# Patient Record
Sex: Female | Born: 1995 | Race: Black or African American | Hispanic: No | Marital: Single | Smoking: Never smoker
Health system: Southern US, Community
[De-identification: ages and names within clinical notes are randomized; demographics above are authoritative.]

## PROBLEM LIST (undated history)

## (undated) DIAGNOSIS — Z3490 Encounter for supervision of normal pregnancy, unspecified, unspecified trimester: Secondary | ICD-10-CM

## (undated) HISTORY — PX: EYE SURGERY: SHX253

---

## 2015-09-14 ENCOUNTER — Other Ambulatory Visit (HOSPITAL_COMMUNITY)
Admission: RE | Admit: 2015-09-14 | Discharge: 2015-09-14 | Disposition: A | Discharge status: Home / Self Care | Payer: Medicaid Other | Source: Ambulatory Visit | Attending: Family Medicine | Admitting: Family Medicine

## 2015-09-14 ENCOUNTER — Emergency Department (INDEPENDENT_AMBULATORY_CARE_PROVIDER_SITE_OTHER)
Admission: EM | Admit: 2015-09-14 | Discharge: 2015-09-14 | Disposition: A | Discharge status: Home / Self Care | Payer: Medicaid Other | Source: Home / Self Care | Attending: Family Medicine | Admitting: Family Medicine

## 2015-09-14 ENCOUNTER — Encounter (HOSPITAL_COMMUNITY): Payer: Self-pay | Admitting: *Deleted

## 2015-09-14 DIAGNOSIS — Z113 Encounter for screening for infections with a predominantly sexual mode of transmission: Secondary | ICD-10-CM | POA: Insufficient documentation

## 2015-09-14 DIAGNOSIS — N760 Acute vaginitis: Secondary | ICD-10-CM | POA: Insufficient documentation

## 2015-09-14 LAB — POCT URINALYSIS DIP (DEVICE)
Bilirubin Urine: NEGATIVE
Glucose, UA: NEGATIVE mg/dL
Hgb urine dipstick: NEGATIVE
KETONES UR: NEGATIVE mg/dL
Nitrite: NEGATIVE
PH: 7.5 (ref 5.0–8.0)
PROTEIN: NEGATIVE mg/dL
SPECIFIC GRAVITY, URINE: 1.015 (ref 1.005–1.030)
Urobilinogen, UA: 0.2 mg/dL (ref 0.0–1.0)

## 2015-09-14 LAB — POCT PREGNANCY, URINE: PREG TEST UR: NEGATIVE

## 2015-09-14 MED ORDER — METRONIDAZOLE 0.75 % VA GEL: 1.0000 | Freq: Every day | VAGINAL | Status: DC

## 2015-09-14 NOTE — Discharge Instructions (Signed)
We will call with positive test results and treat as indicated  °

## 2015-09-14 NOTE — ED Notes (Signed)
Pt  Reports   Symptoms of vaginal  Itching        And   Discharge   For   Several  Days    Worse  Yesterday           Denys  Any  Bleeding

## 2015-09-14 NOTE — ED Provider Notes (Signed)
CSN: [id number]     Arrival date & time 09/14/15  1412 History   First MD Initiated Contact with Patient 09/14/15 1427     Chief Complaint  Patient presents with  . Vaginal Discharge   (Consider location/radiation/quality/duration/timing/severity/associated sxs/prior Treatment) Patient is a 19 y.o. female presenting with vaginal discharge. The history is provided by the patient.  Vaginal Discharge Quality:  Mucoid Severity:  Mild Duration:  2 days Progression:  Unchanged Chronicity:  New Relieved by:  None tried Worsened by:  Nothing tried Ineffective treatments:  None tried Associated symptoms: no abdominal pain and no dysuria     History reviewed. No pertinent past medical history. History reviewed. No pertinent past surgical history. History reviewed. No pertinent family history. Social History  Substance Use Topics  . Smoking status: None  . Smokeless tobacco: None  . Alcohol Use: No   OB History    No data available     Review of Systems  Gastrointestinal: Negative.  Negative for abdominal pain.  Genitourinary: Positive for vaginal discharge. Negative for dysuria, vaginal bleeding, menstrual problem and pelvic pain.  All other systems reviewed and are negative.   Allergies  Review of patient's allergies indicates not on file.  Home Medications   Prior to Admission medications   Medication Sig Start Date End Date Taking? Authorizing Provider  metroNIDAZOLE (METROGEL VAGINAL) 0.75 % vaginal gel Place 1 Applicatorful vaginally at bedtime. For 5 nights 09/14/15   Linna Hoff, MD   Meds Ordered and Administered this Visit  Medications - No data to display  BP 120/60 mmHg  Pulse 78  Temp(Src) 98.6 F (37 C) (Oral)  Resp 16  LMP 09/11/2015 No data found.   Physical Exam  Constitutional: She is oriented to person, place, and time. She appears well-developed and well-nourished.  Abdominal: Soft. Bowel sounds are normal.  Genitourinary: Vagina normal and  uterus normal. Pelvic exam was performed with patient supine. No erythema, tenderness or bleeding in the vagina. No foreign body around the vagina. No vaginal discharge found.  Musculoskeletal: Normal range of motion.  Neurological: She is alert and oriented to person, place, and time.  Skin: Skin is warm and dry.  Nursing note and vitals reviewed.   ED Course  Procedures (including critical care time)  Labs Review Labs Reviewed  POCT URINALYSIS DIP (DEVICE) - Abnormal; Notable for the following:    Leukocytes, UA SMALL (*)    All other components within normal limits  HIV ANTIBODY (ROUTINE TESTING)  POCT PREGNANCY, URINE  CERVICOVAGINAL ANCILLARY ONLY    Imaging Review No results found.   Visual Acuity Review  Right Eye Distance:   Left Eye Distance:   Bilateral Distance:    Right Eye Near:   Left Eye Near:    Bilateral Near:         MDM   1. Vaginitis    rx for metrogel vag.    Linna Hoff, MD 09/14/15 [phone]

## 2015-09-15 LAB — CERVICOVAGINAL ANCILLARY ONLY
Chlamydia: NEGATIVE
Neisseria Gonorrhea: NEGATIVE

## 2015-09-18 LAB — CERVICOVAGINAL ANCILLARY ONLY: Wet Prep (BD Affirm): POSITIVE — AB

## 2015-09-18 NOTE — ED Notes (Signed)
Final report of vaginal swab labs positive for gardnerella only called patinet, and discussed labs, and to use Rx as written

## 2016-11-25 ENCOUNTER — Encounter (HOSPITAL_COMMUNITY): Payer: Self-pay

## 2016-11-25 ENCOUNTER — Emergency Department (HOSPITAL_COMMUNITY): Payer: Medicaid Other

## 2016-11-25 ENCOUNTER — Emergency Department (HOSPITAL_COMMUNITY)
Admission: EM | Admit: 2016-11-25 | Discharge: 2016-11-25 | Disposition: A | Discharge status: Home / Self Care | Payer: Medicaid Other | Attending: Emergency Medicine | Admitting: Emergency Medicine

## 2016-11-25 DIAGNOSIS — O0281 Inappropriate change in quantitative human chorionic gonadotropin (hCG) in early pregnancy: Secondary | ICD-10-CM | POA: Insufficient documentation

## 2016-11-25 DIAGNOSIS — O26891 Other specified pregnancy related conditions, first trimester: Secondary | ICD-10-CM | POA: Diagnosis present

## 2016-11-25 DIAGNOSIS — R112 Nausea with vomiting, unspecified: Secondary | ICD-10-CM

## 2016-11-25 DIAGNOSIS — O26892 Other specified pregnancy related conditions, second trimester: Secondary | ICD-10-CM

## 2016-11-25 DIAGNOSIS — O219 Vomiting of pregnancy, unspecified: Secondary | ICD-10-CM | POA: Insufficient documentation

## 2016-11-25 DIAGNOSIS — R109 Unspecified abdominal pain: Secondary | ICD-10-CM

## 2016-11-25 LAB — WET PREP, GENITAL
Clue Cells Wet Prep HPF POC: NONE SEEN
Sperm: NONE SEEN
Trich, Wet Prep: NONE SEEN
Yeast Wet Prep HPF POC: NONE SEEN

## 2016-11-25 LAB — CBC
HCT: 35.4 % — ABNORMAL LOW (ref 36.0–46.0)
Hemoglobin: 11.8 g/dL — ABNORMAL LOW (ref 12.0–15.0)
MCH: 27.4 pg (ref 26.0–34.0)
MCHC: 33.3 g/dL (ref 30.0–36.0)
MCV: 82.1 fL (ref 78.0–100.0)
PLATELETS: 192 10*3/uL (ref 150–400)
RBC: 4.31 MIL/uL (ref 3.87–5.11)
RDW: 13.3 % (ref 11.5–15.5)
WBC: 10 10*3/uL (ref 4.0–10.5)

## 2016-11-25 LAB — COMPREHENSIVE METABOLIC PANEL
ALT: 18 U/L (ref 14–54)
AST: 21 U/L (ref 15–41)
Albumin: 3.4 g/dL — ABNORMAL LOW (ref 3.5–5.0)
Alkaline Phosphatase: 47 U/L (ref 38–126)
Anion gap: 11 (ref 5–15)
BILIRUBIN TOTAL: 0.6 mg/dL (ref 0.3–1.2)
BUN: 11 mg/dL (ref 6–20)
CALCIUM: 8.9 mg/dL (ref 8.9–10.3)
CO2: 21 mmol/L — ABNORMAL LOW (ref 22–32)
CREATININE: 0.7 mg/dL (ref 0.44–1.00)
Chloride: 105 mmol/L (ref 101–111)
GFR calc Af Amer: >60 mL/min (ref >60)
GFR calc non Af Amer: >60 mL/min (ref >60)
Glucose, Bld: 100 mg/dL — ABNORMAL HIGH (ref 65–99)
Potassium: 3.8 mmol/L (ref 3.5–5.1)
Sodium: 137 mmol/L (ref 135–145)
TOTAL PROTEIN: 6.6 g/dL (ref 6.5–8.1)

## 2016-11-25 LAB — URINALYSIS, ROUTINE W REFLEX MICROSCOPIC
Bilirubin Urine: NEGATIVE
Glucose, UA: NEGATIVE mg/dL
Hgb urine dipstick: NEGATIVE
KETONES UR: NEGATIVE mg/dL
LEUKOCYTES UA: NEGATIVE
NITRITE: NEGATIVE
PROTEIN: NEGATIVE mg/dL
Specific Gravity, Urine: 1.027 (ref 1.005–1.030)
pH: 5 (ref 5.0–8.0)

## 2016-11-25 LAB — ABO/RH: ABO/RH(D): O POS

## 2016-11-25 LAB — HCG, QUANTITATIVE, PREGNANCY: hCG, Beta Chain, Quant, S: 135895 m[IU]/mL — ABNORMAL HIGH (ref <5)

## 2016-11-25 LAB — LIPASE, BLOOD: Lipase: 16 U/L (ref 11–51)

## 2016-11-25 MED ORDER — ACETAMINOPHEN 500 MG PO TABS
1000.0000 mg | ORAL_TABLET | Freq: Once | ORAL | Status: AC
  Administered 2016-11-25: 1000 mg via ORAL
  Filled 2016-11-25: qty 2

## 2016-11-25 MED ORDER — METOCLOPRAMIDE HCL 10 MG PO TABS
10.0000 mg | ORAL_TABLET | Freq: Once | ORAL | Status: AC
  Administered 2016-11-25: 10 mg via ORAL
  Filled 2016-11-25: qty 1

## 2016-11-25 MED ORDER — METOCLOPRAMIDE HCL 10 MG PO TABS: 10.0000 mg | ORAL_TABLET | Freq: Four times a day (QID) | ORAL | 0 refills | Status: DC | PRN

## 2016-11-25 NOTE — Discharge Instructions (Signed)
You may take Tylenol 1000 mg every 6 hours as needed for pain.   Please follow-up with your OB/GYN.

## 2016-11-25 NOTE — ED Notes (Signed)
Patient transported to Ultrasound 

## 2016-11-25 NOTE — ED Notes (Signed)
ED Provider at bedside. 

## 2016-11-25 NOTE — ED Triage Notes (Signed)
Pt complaining of nausea and vomiting x 4. Pt complaining of generalized weakness. Pt complaining of lower abdominal pain. Pt states [redacted] weeks pregnant. Pt denies any urinary issues. Pt denies bleeding, states some white discharge.

## 2016-11-25 NOTE — ED Provider Notes (Signed)
CHIEF COMPLAINT: Lower abdominal pain, vaginal discharge, nausea and vomiting  HPI: Pt is a 20 y.o. 20 year old female who is a G2 P1 who presents emergency department with lower abdominal cramping that started one day ago. Also has had increased white vaginal discharge. Noticed one episode of vaginal bleeding that she described as a spotting that resolved yesterday. No dysuria or hematuria. Has had nausea and vomiting throughout this pregnancy but worse over the past several days. No diarrhea. States she has been to the pregnancy Center and was told that she was 12 weeks today. Reports she did have an ultrasound. States she thinks her last menstrual period was in the middle of September. She has had Chlamydia in the past that was treated. Sectioned active with one partner. No history of abdominal surgeries. Denies fever.  ROS: See HPI Constitutional: no fever  Eyes: no drainage  ENT: no runny nose   Cardiovascular:  no chest pain  Resp: no SOB  GI: no vomiting GU: no dysuria Integumentary: no rash  Allergy: no hives  Musculoskeletal: no leg swelling  Neurological: no slurred speech ROS otherwise negative  PAST MEDICAL HISTORY/PAST SURGICAL HISTORY:  History reviewed. No pertinent past medical history.  MEDICATIONS:  Prior to Admission medications   Medication Sig Start Date End Date Taking? Authorizing Provider  metroNIDAZOLE (METROGEL VAGINAL) 0.75 % vaginal gel Place 1 Applicatorful vaginally at bedtime. For 5 nights 09/14/15   Linna HoffJames D Kindl, MD    ALLERGIES:  Not on File  SOCIAL HISTORY:  Social History  Substance Use Topics  . Smoking status: Never Smoker  . Smokeless tobacco: Never Used  . Alcohol use No    FAMILY HISTORY: History reviewed. No pertinent family history.  EXAM: BP 94/61   Pulse 106   Temp 98.5 F (36.9 C) (Oral)   Resp 16   SpO2 100%  CONSTITUTIONAL: Alert and oriented and responds appropriately to questions. Well-appearing; well-nourished HEAD:  Normocephalic EYES: Conjunctivae clear, PERRL, EOMI ENT: normal nose; no rhinorrhea; moist mucous membranes NECK: Supple, no meningismus, no nuchal rigidity, no LAD  CARD: RRR; S1 and S2 appreciated; no murmurs, no clicks, no rubs, no gallops RESP: Normal chest excursion without splinting or tachypnea; breath sounds clear and equal bilaterally; no wheezes, no rhonchi, no rales, no hypoxia or respiratory distress, speaking full sentences ABD/GI: Normal bowel sounds; non-distended; soft, Minimally tender throughout the pelvic region, no tenderness at McBurney's point, no rebound, no guarding, no peritoneal signs, no hepatosplenomegaly GU:  Normal external genitalia. No lesions, rashes noted. Patient has no vaginal bleeding on exam. Moderate amount of thin white vaginal discharge.  Mild bilateral adnexal tenderness.  No adnexal mass or fullness, no cervical motion tenderness. Cervix is not appear friable.  Cervix is closed.  Chaperone present for exam. BACK:  The back appears normal and is non-tender to palpation, there is no CVA tenderness EXT: Normal ROM in all joints; non-tender to palpation; no edema; normal capillary refill; no cyanosis, no calf tenderness or swelling    SKIN: Normal color for age and race; warm; no rash NEURO: Moves all extremities equally, sensation to light touch intact diffusely, cranial nerves II through XII intact, normal speech PSYCH: The patient's mood and manner are appropriate. Grooming and personal hygiene are appropriate.  MEDICAL DECISION MAKING: Patient here with lower abdominal pain, episode of vaginal bleeding, discharge and cramping. Increase nausea and vomiting. No tenderness at McBurney's point. Doubt appendicitis. Afebrile, nontoxic. We'll send pelvic cultures, urinalysis and obtain a transvaginal ultrasound. We'll  give Tylenol for pain, Reglan for nausea and reassess.  Labs ordered in triage had been unremarkable.  ED PROGRESS: The prep shows many white blood  cells but no other sign of infection. Blood type is O+. She does not need RhoGAM.  Urine shows no sign of infection.  Transvaginal ultrasound shows a single live intrauterine pregnancy at 12 weeks and 4 days with good Doppler flow to both ovaries. I feel she is safe to be discharged and follow-up with her OB/GYN as needed. We'll discharge with prescription for Reglan to take as needed for nausea and vomiting. She has not had any vomiting here and is requesting something to eat. Abdominal exam is still benign. Have advised her to use Tylenol as needed for pain at home. Discussed return precautions.   At this time, I do not feel there is any life-threatening condition present. I have reviewed and discussed all results (EKG, imaging, lab, urine as appropriate) and exam findings with patient/family. I have reviewed nursing notes and appropriate previous records.  I feel the patient is safe to be discharged home without further emergent workup and can continue workup as an outpatient as needed. Discussed usual and customary return precautions. Patient/family verbalize understanding and are comfortable with this plan.  Outpatient follow-up has been provided. All questions have been answered.     Layla MawKristen N Martine Trageser, DO 11/25/16 [phone]

## 2016-11-26 LAB — GC/CHLAMYDIA PROBE AMP (~~LOC~~) NOT AT ARMC
Chlamydia: NEGATIVE
Neisseria Gonorrhea: NEGATIVE

## 2016-12-03 ENCOUNTER — Encounter: Payer: Medicaid Other | Admitting: Student

## 2017-01-15 ENCOUNTER — Encounter: Payer: Self-pay | Admitting: Obstetrics and Gynecology

## 2017-01-15 ENCOUNTER — Ambulatory Visit (INDEPENDENT_AMBULATORY_CARE_PROVIDER_SITE_OTHER): Payer: Medicaid Other | Admitting: Obstetrics and Gynecology

## 2017-01-15 VITALS — BP 114/70 | HR 70 | Temp 97.5°F | Ht 64.0 in | Wt 180.0 lb

## 2017-01-15 DIAGNOSIS — Z3490 Encounter for supervision of normal pregnancy, unspecified, unspecified trimester: Secondary | ICD-10-CM | POA: Insufficient documentation

## 2017-01-15 DIAGNOSIS — Z3482 Encounter for supervision of other normal pregnancy, second trimester: Secondary | ICD-10-CM

## 2017-01-15 DIAGNOSIS — Z3480 Encounter for supervision of other normal pregnancy, unspecified trimester: Secondary | ICD-10-CM

## 2017-01-15 NOTE — Progress Notes (Signed)
Patient complains of pain periodically everyday, but she is not sure if it is contractions, reports good fetal movement.

## 2017-01-15 NOTE — Patient Instructions (Signed)
Second Trimester of Pregnancy The second trimester is from week 13 through week 28 (months 4 through 6). The second trimester is often a time when you feel your best. Your body has also adjusted to being pregnant, and you begin to feel better physically. Usually, morning sickness has lessened or quit completely, you may have more energy, and you may have an increase in appetite. The second trimester is also a time when the fetus is growing rapidly. At the end of the sixth month, the fetus is about 9 inches long and weighs about 1 pounds. You will likely begin to feel the baby move (quickening) between 18 and 20 weeks of the pregnancy. Body changes during your second trimester Your body continues to go through many changes during your second trimester. The changes vary from woman to woman.  Your weight will continue to increase. You will notice your lower abdomen bulging out.  You may begin to get stretch marks on your hips, abdomen, and breasts.  You may develop headaches that can be relieved by medicines. The medicines should be approved by your health care provider.  You may urinate more often because the fetus is pressing on your bladder.  You may develop or continue to have heartburn as a result of your pregnancy.  You may develop constipation because certain hormones are causing the muscles that push waste through your intestines to slow down.  You may develop hemorrhoids or swollen, bulging veins (varicose veins).  You may have back pain. This is caused by:  Weight gain.  Pregnancy hormones that are relaxing the joints in your pelvis.  A shift in weight and the muscles that support your balance.  Your breasts will continue to grow and they will continue to become tender.  Your gums may bleed and may be sensitive to brushing and flossing.  Dark spots or blotches (chloasma, mask of pregnancy) may develop on your face. This will likely fade after the baby is born.  A dark line  from your belly button to the pubic area (linea nigra) may appear. This will likely fade after the baby is born.  You may have changes in your hair. These can include thickening of your hair, rapid growth, and changes in texture. Some women also have hair loss during or after pregnancy, or hair that feels dry or thin. Your hair will most likely return to normal after your baby is born. What to expect at prenatal visits During a routine prenatal visit:  You will be weighed to make sure you and the fetus are growing normally.  Your blood pressure will be taken.  Your abdomen will be measured to track your baby's growth.  The fetal heartbeat will be listened to.  Any test results from the previous visit will be discussed. Your health care provider may ask you:  How you are feeling.  If you are feeling the baby move.  If you have had any abnormal symptoms, such as leaking fluid, bleeding, severe headaches, or abdominal cramping.  If you are using any tobacco products, including cigarettes, chewing tobacco, and electronic cigarettes.  If you have any questions. Other tests that may be performed during your second trimester include:  Blood tests that check for:  Low iron levels (anemia).  Gestational diabetes (between 24 and 28 weeks).  Rh antibodies. This is to check for a protein on red blood cells (Rh factor).  Urine tests to check for infections, diabetes, or protein in the urine.  An ultrasound to   confirm the proper growth and development of the baby.  An amniocentesis to check for possible genetic problems.  Fetal screens for spina bifida and Down syndrome.  HIV (human immunodeficiency virus) testing. Routine prenatal testing includes screening for HIV, unless you choose not to have this test. Follow these instructions at home: Eating and drinking  Continue to eat regular, healthy meals.  Avoid raw meat, uncooked cheese, cat litter boxes, and soil used by cats. These  carry germs that can cause birth defects in the baby.  Take your prenatal vitamins.  Take 1500-2000 mg of calcium daily starting at the 20th week of pregnancy until you deliver your baby.  If you develop constipation:  Take over-the-counter or prescription medicines.  Drink enough fluid to keep your urine clear or pale yellow.  Eat foods that are high in fiber, such as fresh fruits and vegetables, whole grains, and beans.  Limit foods that are high in fat and processed sugars, such as fried and sweet foods. Activity  Exercise only as directed by your health care provider. Experiencing uterine cramps is a good sign to stop exercising.  Avoid heavy lifting, wear low heel shoes, and practice good posture.  Wear your seat belt at all times when driving.  Rest with your legs elevated if you have leg cramps or low back pain.  Wear a good support bra for breast tenderness.  Do not use hot tubs, steam rooms, or saunas. Lifestyle  Avoid all smoking, herbs, alcohol, and unprescribed drugs. These chemicals affect the formation and growth of the baby.  Do not use any products that contain nicotine or tobacco, such as cigarettes and e-cigarettes. If you need help quitting, ask your health care provider.  A sexual relationship may be continued unless your health care provider directs you otherwise. General instructions  Follow your health care provider's instructions regarding medicine use. There are medicines that are either safe or unsafe to take during pregnancy.  Take warm sitz baths to soothe any pain or discomfort caused by hemorrhoids. Use hemorrhoid cream if your health care provider approves.  If you develop varicose veins, wear support hose. Elevate your feet for 15 minutes, 3-4 times a day. Limit salt in your diet.  Visit your dentist if you have not gone yet during your pregnancy. Use a soft toothbrush to brush your teeth and be gentle when you floss.  Keep all follow-up  prenatal visits as told by your health care provider. This is important. Contact a health care provider if:  You have dizziness.  You have mild pelvic cramps, pelvic pressure, or nagging pain in the abdominal area.  You have persistent nausea, vomiting, or diarrhea.  You have a bad smelling vaginal discharge.  You have pain with urination. Get help right away if:  You have a fever.  You are leaking fluid from your vagina.  You have spotting or bleeding from your vagina.  You have severe abdominal cramping or pain.  You have rapid weight gain or weight loss.  You have shortness of breath with chest pain.  You notice sudden or extreme swelling of your face, hands, ankles, feet, or legs.  You have not felt your baby move in over an hour.  You have severe headaches that do not go away with medicine.  You have vision changes. Summary  The second trimester is from week 13 through week 28 (months 4 through 6). It is also a time when the fetus is growing rapidly.  Your body goes   through many changes during pregnancy. The changes vary from woman to woman.  Avoid all smoking, herbs, alcohol, and unprescribed drugs. These chemicals affect the formation and growth your baby.  Do not use any tobacco products, such as cigarettes, chewing tobacco, and e-cigarettes. If you need help quitting, ask your health care provider.  Contact your health care provider if you have any questions. Keep all prenatal visits as told by your health care provider. This is important. This information is not intended to replace advice given to you by your health care provider. Make sure you discuss any questions you have with your health care provider. Document Released: 11/26/2001 Document Revised: 05/09/2016 Document Reviewed: 02/02/2013 Elsevier Interactive Patient Education  2017 [employer]  

## 2017-01-15 NOTE — Progress Notes (Signed)
Subjective:  Teresa Armstrong is a 21 y.o. G2P1001 at 4133w6d being seen today for ongoing prenatal care. [place] Prenatal labs drawn and are in chart. She has some occ abd pain which appears to be consistent with round ligament pain. She is currently monitored for the following issues for this low-risk pregnancy and has Supervision of normal pregnancy, antepartum on her problem list.  Patient reports round ligament pain.  Contractions: Irritability. Vag. Bleeding: None.  Movement: Present. Denies leaking of fluid.   The following portions of the patient's history were reviewed and updated as appropriate: allergies, current medications, past family history, past medical history, past social history, past surgical history and problem list. Problem list updated.  Objective:   Vitals:   01/15/17 1455 01/15/17 1507  BP: 114/70   Pulse: 70   Temp: 97.5 F (36.4 C)   Weight: 180 lb (81.6 kg)   Height:  5\' 4"  (1.626 m)    Fetal Status: Fetal Heart Rate (bpm): 142   Movement: Present     General:  Alert, oriented and cooperative. Patient is in no acute distress.  Skin: Skin is warm and dry. No rash noted.   Cardiovascular: Normal heart rate noted  Respiratory: Normal respiratory effort, no problems with respiration noted  Abdomen: Soft, gravid, appropriate for gestational age. Pain/Pressure: Present     Pelvic:  Cervical exam performed        Extremities: Normal range of motion.  Edema: None  Mental Status: Normal mood and affect. Normal behavior. Normal judgment and thought content.   Urinalysis:      Assessment and Plan:  Pregnancy: G2P1001 at 333w6d  1. Supervision of other normal pregnancy, antepartum Prenatal care reviewed with pt Declines flu vaccine - ToxASSURE Select 13 (MW), Urine - US MFM OB COMP + 14 WK; Future  Preterm labor symptoms and general obstetric precautions including but not limited to vaginal bleeding, contractions, leaking of fluid  and fetal movement were reviewed in detail with the patient. Please refer to After Visit Summary for other counseling recommendations.  Return in about 4 weeks (around 02/12/2017) for OB visit.   Hermina StaggersMichael L Ervin, MD

## 2017-01-22 LAB — TOXASSURE SELECT 13 (MW), URINE

## 2017-01-24 ENCOUNTER — Ambulatory Visit (HOSPITAL_COMMUNITY)
Admission: RE | Admit: 2017-01-24 | Discharge: 2017-01-24 | Disposition: A | Discharge status: Home / Self Care | Payer: Medicaid Other | Source: Ambulatory Visit | Attending: Obstetrics and Gynecology | Admitting: Obstetrics and Gynecology

## 2017-01-24 DIAGNOSIS — Z3A21 21 weeks gestation of pregnancy: Secondary | ICD-10-CM | POA: Diagnosis not present

## 2017-01-24 DIAGNOSIS — Z3689 Encounter for other specified antenatal screening: Secondary | ICD-10-CM | POA: Diagnosis present

## 2017-01-24 DIAGNOSIS — Z3480 Encounter for supervision of other normal pregnancy, unspecified trimester: Secondary | ICD-10-CM

## 2017-01-27 ENCOUNTER — Other Ambulatory Visit: Payer: Self-pay

## 2017-01-27 DIAGNOSIS — Z3480 Encounter for supervision of other normal pregnancy, unspecified trimester: Secondary | ICD-10-CM

## 2017-02-12 ENCOUNTER — Encounter: Payer: Self-pay | Admitting: Certified Nurse Midwife

## 2017-02-12 ENCOUNTER — Ambulatory Visit (INDEPENDENT_AMBULATORY_CARE_PROVIDER_SITE_OTHER): Payer: Medicaid Other | Admitting: Certified Nurse Midwife

## 2017-02-12 VITALS — BP 109/54 | HR 92 | Wt 190.0 lb

## 2017-02-12 DIAGNOSIS — Z3490 Encounter for supervision of normal pregnancy, unspecified, unspecified trimester: Secondary | ICD-10-CM

## 2017-02-12 DIAGNOSIS — Z3493 Encounter for supervision of normal pregnancy, unspecified, third trimester: Secondary | ICD-10-CM

## 2017-02-12 NOTE — Progress Notes (Signed)
   PRENATAL VISIT NOTE  Subjective:  Teresa Armstrong is a 21 y.o. G2P1001 at 2226w6d being seen today for ongoing prenatal care.  She is currently monitored for the following issues for this low-risk pregnancy and has Supervision of normal pregnancy, antepartum on her problem list.  Patient reports no bleeding, no contractions, no cramping and no leaking.  Contractions: Not present. Vag. Bleeding: None.  Movement: Present. Denies leaking of fluid.   The following portions of the patient's history were reviewed and updated as appropriate: allergies, current medications, past family history, past medical history, past social history, past surgical history and problem list. Problem list updated.  Objective:   Vitals:   02/12/17 1443  BP: (!) 109/54  Pulse: 92  Weight: 190 lb (86.2 kg)    Fetal Status: Fetal Heart Rate (bpm): 145 Fundal Height: 23 cm Movement: Present     General:  Alert, oriented and cooperative. Patient is in no acute distress.  Skin: Skin is warm and dry. No rash noted.   Cardiovascular: Normal heart rate noted  Respiratory: Normal respiratory effort, no problems with respiration noted  Abdomen: Soft, gravid, appropriate for gestational age. Pain/Pressure: Present     Pelvic:  Cervical exam deferred        Extremities: Normal range of motion.     Mental Status: Normal mood and affect. Normal behavior. Normal judgment and thought content.   Assessment and Plan:  Pregnancy: G2P1001 at 7726w6d  1. Encounter for supervision of normal pregnancy, antepartum, unspecified gravidity      Normal discomforts of pregnancy discussed. Plans to move back home with parents and her other  Child at the end of the semester in [place], [place].  - TSH - Hemoglobinopathy evaluation - Varicella zoster antibody, IgG - Culture, OB Urine - Hemoglobin A1c - Obstetric Panel, Including HIV - Cystic Fibrosis Mutation 97 - MaterniT21 PLUS Core+SCA  Preterm labor symptoms and general  obstetric precautions including but not limited to vaginal bleeding, contractions, leaking of fluid and fetal movement were reviewed in detail with the patient. Please refer to After Visit Summary for other counseling recommendations.  Return in about 4 weeks (around 03/12/2017) for 2 hr OGTT.   Roe Coombsachelle A Gaila Engebretsen, CNM

## 2017-02-12 NOTE — Progress Notes (Signed)
Pt states she has lower pelvic pain.  Pt states vaginal irritation, uncomfortable when wiping.

## 2017-02-18 LAB — OBSTETRIC PANEL, INCLUDING HIV
Antibody Screen: NEGATIVE
BASOS: 0 %
Basophils Absolute: 0 10*3/uL (ref 0.0–0.2)
EOS (ABSOLUTE): 0.1 10*3/uL (ref 0.0–0.4)
EOS: 2 %
HEMATOCRIT: 29.5 % — AB (ref 34.0–46.6)
HEMOGLOBIN: 9.8 g/dL — AB (ref 11.1–15.9)
HIV Screen 4th Generation wRfx: NONREACTIVE
Hepatitis B Surface Ag: NEGATIVE
IMMATURE GRANS (ABS): 0.1 10*3/uL (ref 0.0–0.1)
IMMATURE GRANULOCYTES: 1 %
LYMPHS ABS: 2.1 10*3/uL (ref 0.7–3.1)
LYMPHS: 24 %
MCH: 28.2 pg (ref 26.6–33.0)
MCHC: 33.2 g/dL (ref 31.5–35.7)
MCV: 85 fL (ref 79–97)
Monocytes Absolute: 0.6 10*3/uL (ref 0.1–0.9)
Monocytes: 6 %
Neutrophils Absolute: 5.8 10*3/uL (ref 1.4–7.0)
Neutrophils: 67 %
Platelets: 181 10*3/uL (ref 150–379)
RBC: 3.47 x10E6/uL — ABNORMAL LOW (ref 3.77–5.28)
RDW: 14.5 % (ref 12.3–15.4)
RH TYPE: POSITIVE
RPR: NONREACTIVE
Rubella Antibodies, IGG: <0.9 index — ABNORMAL LOW (ref >0.99)
WBC: 8.7 10*3/uL (ref 3.4–10.8)

## 2017-02-18 LAB — HEMOGLOBINOPATHY EVALUATION
HEMOGLOBIN F QUANTITATION: 0 % (ref 0.0–2.0)
HGB A: 97.1 % (ref 96.4–98.8)
HGB C: 0 %
HGB S: 0 %
HGB VARIANT: 0 %
Hemoglobin A2 Quantitation: 2.9 % (ref 1.8–3.2)

## 2017-02-18 LAB — HEMOGLOBIN A1C
ESTIMATED AVERAGE GLUCOSE: 88 mg/dL
HEMOGLOBIN A1C: 4.7 % — AB (ref 4.8–5.6)

## 2017-02-18 LAB — TSH: TSH: 1.19 u[IU]/mL (ref 0.450–4.500)

## 2017-02-18 LAB — VARICELLA ZOSTER ANTIBODY, IGG: Varicella zoster IgG: 209 index (ref >165)

## 2017-02-18 LAB — CYSTIC FIBROSIS MUTATION 97: Interpretation: NOT DETECTED

## 2017-02-19 ENCOUNTER — Telehealth: Payer: Self-pay

## 2017-02-19 ENCOUNTER — Other Ambulatory Visit: Payer: Self-pay | Admitting: Certified Nurse Midwife

## 2017-02-19 DIAGNOSIS — Z2839 Other underimmunization status: Secondary | ICD-10-CM | POA: Insufficient documentation

## 2017-02-19 DIAGNOSIS — O09899 Supervision of other high risk pregnancies, unspecified trimester: Secondary | ICD-10-CM

## 2017-02-19 DIAGNOSIS — O99012 Anemia complicating pregnancy, second trimester: Secondary | ICD-10-CM

## 2017-02-19 DIAGNOSIS — Z283 Underimmunization status: Secondary | ICD-10-CM

## 2017-02-19 DIAGNOSIS — Z3480 Encounter for supervision of other normal pregnancy, unspecified trimester: Secondary | ICD-10-CM

## 2017-02-19 DIAGNOSIS — O9989 Other specified diseases and conditions complicating pregnancy, childbirth and the puerperium: Principal | ICD-10-CM

## 2017-02-19 LAB — MATERNIT21 PLUS CORE+SCA
Chromosome 13: NEGATIVE
Chromosome 18: NEGATIVE
Chromosome 21: NEGATIVE
Y CHROMOSOME: DETECTED

## 2017-02-19 NOTE — Telephone Encounter (Signed)
Spoke to patient and advised of results, and med management, patient agreed.

## 2017-02-25 ENCOUNTER — Ambulatory Visit (HOSPITAL_COMMUNITY): Payer: Medicaid Other

## 2017-02-26 ENCOUNTER — Ambulatory Visit (HOSPITAL_COMMUNITY)
Admission: RE | Admit: 2017-02-26 | Discharge: 2017-02-26 | Disposition: A | Discharge status: Home / Self Care | Payer: Medicaid Other | Source: Ambulatory Visit | Attending: Obstetrics and Gynecology | Admitting: Obstetrics and Gynecology

## 2017-02-26 DIAGNOSIS — Z362 Encounter for other antenatal screening follow-up: Secondary | ICD-10-CM | POA: Diagnosis not present

## 2017-02-26 DIAGNOSIS — Z3A25 25 weeks gestation of pregnancy: Secondary | ICD-10-CM | POA: Diagnosis not present

## 2017-02-26 DIAGNOSIS — Z3480 Encounter for supervision of other normal pregnancy, unspecified trimester: Secondary | ICD-10-CM

## 2017-03-12 ENCOUNTER — Other Ambulatory Visit: Payer: Medicaid Other

## 2017-03-12 ENCOUNTER — Encounter: Payer: Self-pay | Admitting: Certified Nurse Midwife

## 2017-03-12 ENCOUNTER — Ambulatory Visit (INDEPENDENT_AMBULATORY_CARE_PROVIDER_SITE_OTHER): Payer: Medicaid Other | Admitting: Certified Nurse Midwife

## 2017-03-12 ENCOUNTER — Other Ambulatory Visit (HOSPITAL_COMMUNITY)
Admission: RE | Admit: 2017-03-12 | Discharge: 2017-03-12 | Disposition: A | Discharge status: Home / Self Care | Payer: Medicaid Other | Source: Ambulatory Visit | Attending: Certified Nurse Midwife | Admitting: Certified Nurse Midwife

## 2017-03-12 VITALS — BP 115/68 | HR 103 | Temp 97.4°F | Wt 199.0 lb

## 2017-03-12 DIAGNOSIS — Z3480 Encounter for supervision of other normal pregnancy, unspecified trimester: Secondary | ICD-10-CM

## 2017-03-12 DIAGNOSIS — O4702 False labor before 37 completed weeks of gestation, second trimester: Secondary | ICD-10-CM | POA: Diagnosis not present

## 2017-03-12 DIAGNOSIS — D649 Anemia, unspecified: Secondary | ICD-10-CM

## 2017-03-12 DIAGNOSIS — B373 Candidiasis of vulva and vagina: Secondary | ICD-10-CM

## 2017-03-12 DIAGNOSIS — O99012 Anemia complicating pregnancy, second trimester: Secondary | ICD-10-CM

## 2017-03-12 DIAGNOSIS — B3731 Acute candidiasis of vulva and vagina: Secondary | ICD-10-CM

## 2017-03-12 DIAGNOSIS — O98812 Other maternal infectious and parasitic diseases complicating pregnancy, second trimester: Secondary | ICD-10-CM

## 2017-03-12 LAB — FETAL FIBRONECTIN: Fetal Fibronectin: NEGATIVE

## 2017-03-12 MED ORDER — CITRANATAL BLOOM 90-1 MG PO TABS: 1.0000 | ORAL_TABLET | Freq: Every day | ORAL | 12 refills | Status: AC

## 2017-03-12 MED ORDER — FLUCONAZOLE 150 MG PO TABS: 150.0000 mg | ORAL_TABLET | Freq: Once | ORAL | 0 refills | Status: AC

## 2017-03-12 NOTE — Progress Notes (Signed)
   PRENATAL VISIT NOTE  Subjective:  Teresa Armstrong is a 21 y.o. G2P1001 at 322w6d being seen today for ongoing prenatal care.  She is currently monitored for the following issues for this low-risk pregnancy and has Supervision of normal pregnancy, antepartum; Rubella non-immune status, antepartum; and Anemia affecting pregnancy in second trimester on her problem list.  Patient reports no bleeding, no leaking and contractions off and on for several days and increased last night with walking, with vaginal pressure, no hx of PTL/PTD, discussed increased fluid intake.  Contractions: Irritability. Vag. Bleeding: None.  Movement: Present. Denies leaking of fluid.   The following portions of the patient's history were reviewed and updated as appropriate: allergies, current medications, past family history, past medical history, past social history, past surgical history and problem list. Problem list updated.  Objective:   Vitals:   03/12/17 0900  BP: 115/68  Pulse: (!) 103  Temp: 97.4 F (36.3 C)  Weight: 199 lb (90.3 kg)    Fetal Status:     Movement: Present     General:  Alert, oriented and cooperative. Patient is in no acute distress.  Skin: Skin is warm and dry. No rash noted.   Cardiovascular: Normal heart rate noted  Respiratory: Normal respiratory effort, no problems with respiration noted  Abdomen: Soft, gravid, appropriate for gestational age. Pain/Pressure: Present     Pelvic:  Cervical exam performed Dilation: Closed Effacement (%): 0  , soft, posterior, outer OS fingertip dilated.    Extremities: Normal range of motion.  Edema: None  Mental Status: Normal mood and affect. Normal behavior. Normal judgment and thought content.  NST: + accels, no decels, moderate variability, Cat. 1 tracing. No contractions on toco.   Assessment and Plan:  Pregnancy: G2P1001 at 1322w6d  1. Supervision of other normal pregnancy, antepartum     Preterm labor symptoms.  NST reactive for  gestational age, no contractions in office on toco - Glucose Tolerance, 2 Hours w/1 Hour - HIV antibody - RPR - CBC - Culture, OB Urine  2. Preterm uterine contractions in second trimester, antepartum     Reactive NST - Fetal fibronectin - Cervicovaginal ancillary only  3. Yeast infection of the vagina      - fluconazole (DIFLUCAN) 150 MG tablet; Take 1 tablet (150 mg total) by mouth once. Repeat dose in 48-72 hours.  Dispense: 2 tablet; Refill: 0  Preterm labor symptoms and general obstetric precautions including but not limited to vaginal bleeding, contractions, leaking of fluid and fetal movement were reviewed in detail with the patient. Please refer to After Visit Summary for other counseling recommendations.  Return in about 4 weeks (around 04/09/2017) for ROB.   Stat Ffn pending: if positive BMZ series is indicated.    Roe Coombsachelle A Denney, CNM

## 2017-03-12 NOTE — Addendum Note (Signed)
Addended by: Samantha CrimesENNEY, Nikolette Reindl ANNE on: 03/12/2017 10:49 AM   Modules accepted: Orders

## 2017-03-12 NOTE — Progress Notes (Signed)
Patient reports feeling pressure and good fetal movement.

## 2017-03-12 NOTE — Addendum Note (Signed)
Addended by: Natale MilchSTALLING, Madisynn Plair D on: 03/12/2017 11:31 AM   Modules accepted: Orders

## 2017-03-13 ENCOUNTER — Other Ambulatory Visit: Payer: Self-pay | Admitting: Certified Nurse Midwife

## 2017-03-13 DIAGNOSIS — Z3480 Encounter for supervision of other normal pregnancy, unspecified trimester: Secondary | ICD-10-CM

## 2017-03-13 LAB — CBC
Hematocrit: 30.9 % — ABNORMAL LOW (ref 34.0–46.6)
Hemoglobin: 10.3 g/dL — ABNORMAL LOW (ref 11.1–15.9)
MCH: 28.7 pg (ref 26.6–33.0)
MCHC: 33.3 g/dL (ref 31.5–35.7)
MCV: 86 fL (ref 79–97)
PLATELETS: 208 10*3/uL (ref 150–379)
RBC: 3.59 x10E6/uL — AB (ref 3.77–5.28)
RDW: 14.3 % (ref 12.3–15.4)
WBC: 8 10*3/uL (ref 3.4–10.8)

## 2017-03-13 LAB — RPR: RPR: NONREACTIVE

## 2017-03-13 LAB — CERVICOVAGINAL ANCILLARY ONLY
Bacterial vaginitis: NEGATIVE
CHLAMYDIA, DNA PROBE: NEGATIVE
Candida vaginitis: NEGATIVE
NEISSERIA GONORRHEA: NEGATIVE
TRICH (WINDOWPATH): NEGATIVE

## 2017-03-13 LAB — GLUCOSE TOLERANCE, 2 HOURS W/ 1HR
GLUCOSE, 2 HOUR: 110 mg/dL (ref 65–152)
Glucose, 1 hour: 105 mg/dL (ref 65–179)
Glucose, Fasting: 76 mg/dL (ref 65–91)

## 2017-03-13 LAB — HIV ANTIBODY (ROUTINE TESTING W REFLEX): HIV SCREEN 4TH GENERATION: NONREACTIVE

## 2017-03-14 LAB — URINE CULTURE, OB REFLEX

## 2017-03-14 LAB — CULTURE, OB URINE

## 2017-04-09 ENCOUNTER — Encounter: Payer: Self-pay | Admitting: Certified Nurse Midwife

## 2017-04-09 ENCOUNTER — Ambulatory Visit (INDEPENDENT_AMBULATORY_CARE_PROVIDER_SITE_OTHER): Payer: Medicaid Other | Admitting: Certified Nurse Midwife

## 2017-04-09 VITALS — BP 124/68 | HR 108 | Temp 99.1°F | Wt 207.0 lb

## 2017-04-09 DIAGNOSIS — D649 Anemia, unspecified: Secondary | ICD-10-CM

## 2017-04-09 DIAGNOSIS — Z3482 Encounter for supervision of other normal pregnancy, second trimester: Secondary | ICD-10-CM

## 2017-04-09 DIAGNOSIS — O9989 Other specified diseases and conditions complicating pregnancy, childbirth and the puerperium: Principal | ICD-10-CM

## 2017-04-09 DIAGNOSIS — J069 Acute upper respiratory infection, unspecified: Secondary | ICD-10-CM

## 2017-04-09 DIAGNOSIS — O99012 Anemia complicating pregnancy, second trimester: Secondary | ICD-10-CM

## 2017-04-09 DIAGNOSIS — O09899 Supervision of other high risk pregnancies, unspecified trimester: Secondary | ICD-10-CM

## 2017-04-09 DIAGNOSIS — Z3480 Encounter for supervision of other normal pregnancy, unspecified trimester: Secondary | ICD-10-CM

## 2017-04-09 DIAGNOSIS — Z283 Underimmunization status: Secondary | ICD-10-CM

## 2017-04-09 LAB — POCT RAPID STREP A (OFFICE): Rapid Strep A Screen: NEGATIVE

## 2017-04-09 MED ORDER — AMOXICILLIN-POT CLAVULANATE 875-125 MG PO TABS: 1.0000 | ORAL_TABLET | Freq: Two times a day (BID) | ORAL | 0 refills | Status: AC

## 2017-04-09 NOTE — Progress Notes (Signed)
PRENATAL VISIT NOTE  Subjective:  Teresa Armstrong is a 21 y.o. G2P1001 at 60w6dbeing seen today for ongoing prenatal care.  She is currently monitored for the following issues for this low-risk pregnancy and has Supervision of normal pregnancy, antepartum; Rubella non-immune status, antepartum; and Anemia affecting pregnancy in second trimester on her problem list.  Patient reports no bleeding, no contractions, no cramping, no leaking and acute URI symptoms, has not been checking her temperature, has been going on for a few days.  Reports cough, and runny nose/eyes.  Contractions: Not present. Vag. Bleeding: None.  Movement: Present. Denies leaking of fluid.   The following portions of the patient's history were reviewed and updated as appropriate: allergies, current medications, past family history, past medical history, past social history, past surgical history and problem list. Problem list updated.  Objective:   Vitals:   04/09/17 1445  BP: 124/68  Pulse: (!) 108  Temp: 99.1 F (37.3 C)  Weight: 207 lb (93.9 kg)    Fetal Status:     Movement: Present     General:  Alert, oriented and cooperative. Patient is in no acute distress.  Skin: Skin is warm and dry. No rash noted.   Cardiovascular: Normal heart rate noted  Respiratory: Normal respiratory effort, no problems with respiration noted  Abdomen: Soft, gravid, appropriate for gestational age. Pain/Pressure: Present     Pelvic:  Cervical exam deferred        Extremities: Normal range of motion.  Edema: Trace  Mental Status: Normal mood and affect. Normal behavior. Normal judgment and thought content.   Negative rapid strep.   Assessment and Plan:  Pregnancy: G2P1001 at 349w6d1. Supervision of other normal pregnancy, antepartum     Acute URI/ILI - POCT rapid strep A - Influenza a and b  2. Rubella non-immune status, antepartum     MMR postpartum  3. Anemia affecting pregnancy in second trimester     Taking  Bloom  4. Acute upper respiratory infection     OTC medications discussed: Benadryl, Mucinex, Sudafed, Robitussin DM - amoxicillin-clavulanate (AUGMENTIN) 875-125 MG tablet; Take 1 tablet by mouth 2 (two) times daily.  Dispense: 14 tablet; Refill: 0  Preterm labor symptoms and general obstetric precautions including but not limited to vaginal bleeding, contractions, leaking of fluid and fetal movement were reviewed in detail with the patient. Please refer to After Visit Summary for other counseling recommendations.  Return in about 2 weeks (around 04/23/2017) for ROB.   RaMorene CrockerCNM

## 2017-04-10 ENCOUNTER — Emergency Department (HOSPITAL_COMMUNITY)
Admission: EM | Admit: 2017-04-10 | Discharge: 2017-04-10 | Disposition: A | Discharge status: Home / Self Care | Payer: Medicaid Other | Attending: Emergency Medicine | Admitting: Emergency Medicine

## 2017-04-10 ENCOUNTER — Encounter (HOSPITAL_COMMUNITY): Payer: Self-pay

## 2017-04-10 DIAGNOSIS — R109 Unspecified abdominal pain: Secondary | ICD-10-CM

## 2017-04-10 DIAGNOSIS — O26893 Other specified pregnancy related conditions, third trimester: Secondary | ICD-10-CM | POA: Insufficient documentation

## 2017-04-10 DIAGNOSIS — Z3A32 32 weeks gestation of pregnancy: Secondary | ICD-10-CM | POA: Insufficient documentation

## 2017-04-10 DIAGNOSIS — R1030 Lower abdominal pain, unspecified: Secondary | ICD-10-CM | POA: Diagnosis not present

## 2017-04-10 HISTORY — DX: Encounter for supervision of normal pregnancy, unspecified, unspecified trimester: Z34.90

## 2017-04-10 LAB — INFLUENZA A AND B
Influenza A Ag, EIA: NEGATIVE
Influenza B Ag, EIA: NEGATIVE

## 2017-04-10 LAB — PLEASE NOTE:

## 2017-04-10 NOTE — ED Notes (Signed)
Spoke with OB RN. Pt is being taken off the monitor at this time. OB RN cleared patient and stated that she was not contracting and her cervix was still closed.

## 2017-04-10 NOTE — ED Provider Notes (Signed)
[facility]-EMERGENCY DEPT Provider Note   CSN: [id number] Arrival date & time: 04/10/17  1124     History   Chief Complaint No chief complaint on file.   HPI Teresa Armstrong is a 21 y.o. female.  HPI  Patient presents with concern of lower abdominal soreness, cramping. She notes that this has been going on for quite some time, but is more severe, persistent today. Patient is a G2 P60 young female, approximately [redacted] weeks pregnant. No new vaginal bleeding, spotting, though she has had this in the past, as recently as yesterday. No new complaints but fever, chills, cough, vomiting. Notably, the patient was seen yesterday by her obstetrics team, had a reassuring evaluation.  Today the patient presents with a administered her from her [employer] to to the patient complaining of pain while at school.   Past Medical History:  Diagnosis Date  . Pregnant     Patient Active Problem List   Diagnosis Date Noted  . Rubella non-immune status, antepartum 02/19/2017  . Anemia affecting pregnancy in second trimester 02/19/2017  . Supervision of normal pregnancy, antepartum 01/15/2017    Past Surgical History:  Procedure Laterality Date  . EYE SURGERY      OB History    Gravida Para Term Preterm AB Living   SAB TAB Ectopic Multiple Live Births           1       Home Medications    Prior to Admission medications   Medication Sig Start Date End Date Taking? Authorizing Provider  Prenatal Vit-Fe Fumarate-FA (PRENATAL MULTIVITAMIN) TABS tablet Take 1 tablet by mouth daily at 12 noon.   Yes Historical Provider, MD  Prenatal-DSS-FeCb-FeGl-FA (CITRANATAL BLOOM) 90-1 MG TABS Take 1 tablet by mouth daily. 03/12/17  Yes Rachelle A Denney, CNM  amoxicillin-clavulanate (AUGMENTIN) 875-125 MG tablet Take 1 tablet by mouth 2 (two) times daily. 04/09/17   Roe Coombs, CNM    Family History No family history on file.  Social History Social History  Substance Use Topics  .  Smoking status: Never Smoker  . Smokeless tobacco: Never Used  . Alcohol use No     Allergies   Patient has no known allergies.   Review of Systems Review of Systems  Constitutional:       Per HPI, otherwise negative  HENT:       Per HPI, otherwise negative  Respiratory:       Per HPI, otherwise negative  Cardiovascular:       Per HPI, otherwise negative  Gastrointestinal: Negative for vomiting.  Endocrine:       Negative aside from HPI  Genitourinary:       Neg aside from HPI   Musculoskeletal:       Per HPI, otherwise negative  Skin: Negative.   Neurological: Negative for syncope.     Physical Exam Updated Vital Signs BP 116/69 (BP Location: Right Arm)   Pulse (!) 101   Temp 98.7 F (37.1 C) (Oral)   Resp 18   LMP 09/03/2016   SpO2 98%   Physical Exam  Constitutional: She is oriented to person, place, and time. She appears well-developed and well-nourished. No distress.  HENT:  Head: Normocephalic and atraumatic.  Eyes: Conjunctivae and EOM are normal.  Cardiovascular: Normal rate and regular rhythm.   Pulmonary/Chest: Effort normal and breath sounds normal. No stridor. No respiratory distress.  Abdominal: She exhibits no distension. There is  no tenderness.  Gravid abdomen, nontender, palpable fetal motion, supraumbilical. Fetal monitoring pads in place.  Musculoskeletal: She exhibits no edema.  Neurological: She is alert and oriented to person, place, and time. No cranial nerve deficit.  Skin: Skin is warm and dry.  Psychiatric: She has a normal mood and affect.  Nursing note and vitals reviewed.    ED Treatments / Results  Labs (all labs ordered are listed, but only abnormal results are displayed) Labs Reviewed - No data to display  Tocometry with fetal heart tones in the 140s.  Chart review notable for evaluation yesterday with her OB team, reassuring note.   Procedures Procedures (including critical care time) Tocometry monitoring for 2  hours.   Initial Impression / Assessment and Plan / ED Course  I have reviewed the triage vital signs and the nursing notes.  Pertinent labs & imaging results that were available during my care of the patient were reviewed by me and considered in my medical decision making (see chart for details).  1:44 PM Patient awake and alert, aware of all findings, she is accompanied by a administered her from school. We discussed the reassuring tocometry results, and follow-up with her obstetrics team. Now, absent any evidence for premature labor, with no complaints, no evidence for infection, the patient was discharged in stable condition.  Final Clinical Impressions(s) / ED Diagnoses   Final diagnoses:  Abdominal pain during pregnancy in third trimester     Gerhard Munch, MD 04/10/17 1415

## 2017-04-10 NOTE — Progress Notes (Signed)
Pt is a G2P1 at [redacted] weeks gestation with c/o of lower abd pain and vaginal pain. Says she was seen at [facility] yesterday and told her provider about the pain. Says they did not check her cervix or address her discomfort. Says she had some spotting yesterday. Small amt of white d/c noted on pt's underwear. No vaginal bleeding or leaking of fluid. Says she had a vaginal delivery with her first child.

## 2017-04-10 NOTE — ED Notes (Signed)
OB RN at the bedside

## 2017-04-10 NOTE — ED Notes (Signed)
Called OB Rapid Response  

## 2017-04-10 NOTE — ED Triage Notes (Signed)
Patient complains of vaginal pressure and discomfort since am. Denies any vaginal bleeding or discharge. [redacted] weeks pregnant and reports some uterine cramping, NAD G2, P1

## 2017-04-10 NOTE — Progress Notes (Signed)
Spoke with Dr. Erin Fulling. Pt is a G2P1 at [redacted] weeks gestation who gets her care at [facility]. She is here today with c/o lower abd pain and vaginal pain. No vaginal bleeding or leaking of fluid. 1 uc noted. FHR tracing is a category 1. Cervix is closed, thk, presenting part high. Pt is OB cleared. Ed staff notified.

## 2017-04-10 NOTE — Discharge Instructions (Signed)
As discussed, your evaluation today has been largely reassuring.  But, it is important that you monitor your condition carefully, and do not hesitate to return to the ED if you develop new, or concerning changes in your condition. ? ?Otherwise, please follow-up with your physician for appropriate ongoing care. ? ?

## 2017-05-07 IMAGING — US US MFM OB FOLLOW-UP
1 series · 14 of 28 positions shown · non-contrast
Comparison: none

[Series 1: us mfm ob follow-up · 14 of 92 slices shown]
[im 4/92]
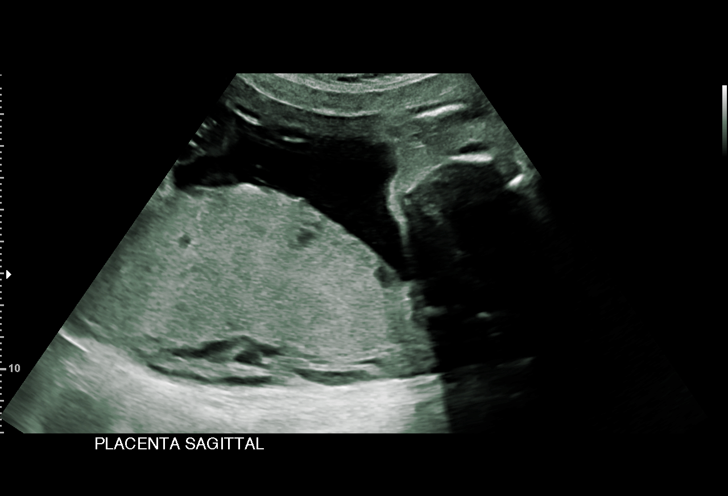
[im 11/92]
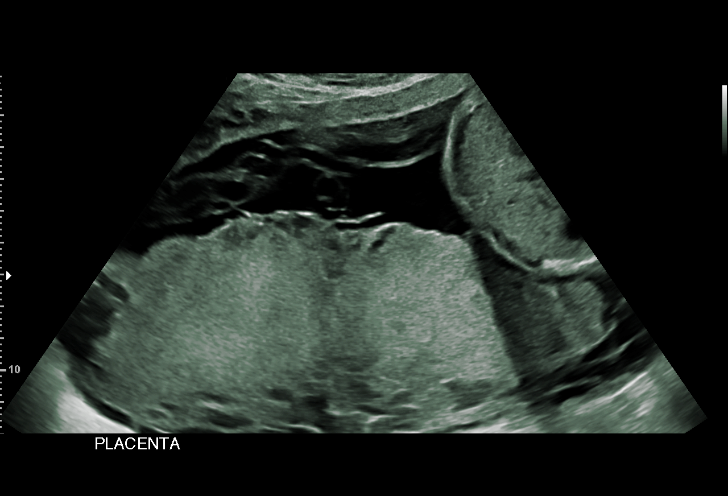
[im 17/92]
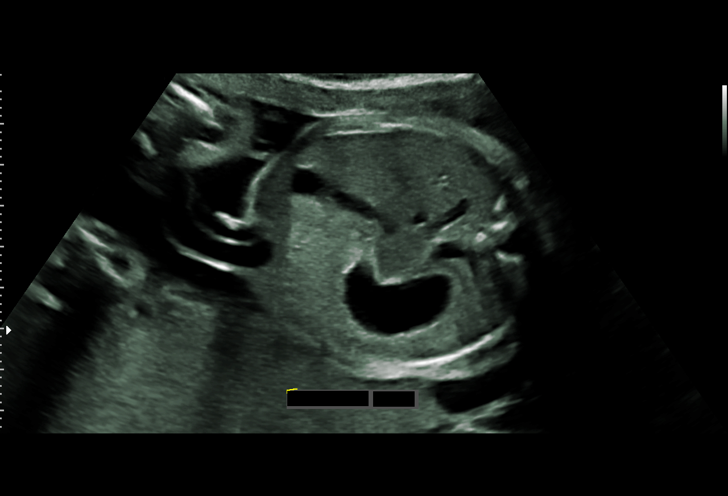
[im 24/92]
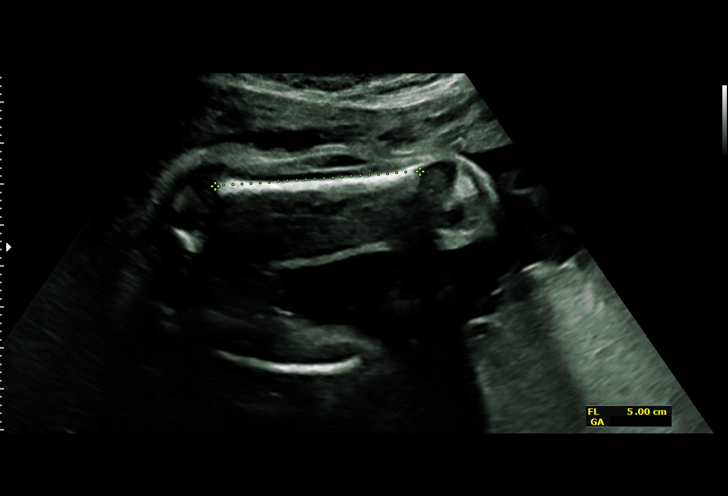
[im 31/92]
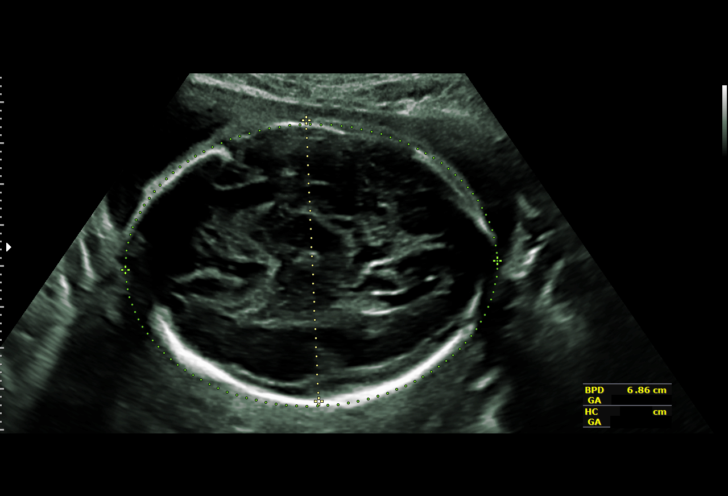
[im 38/92]
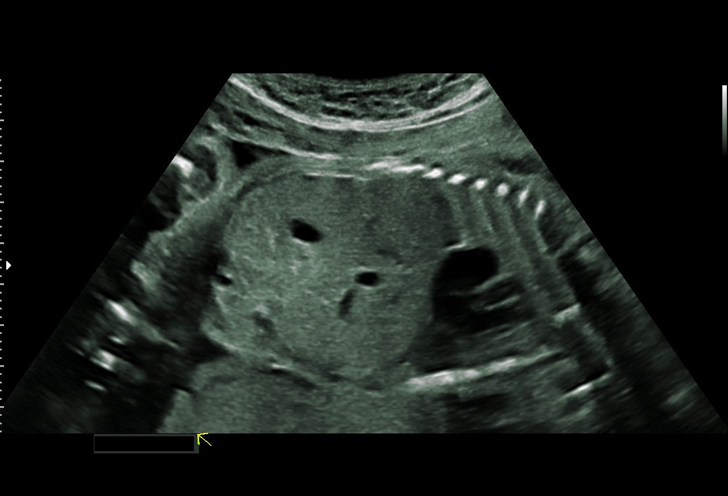
[im 44/92]
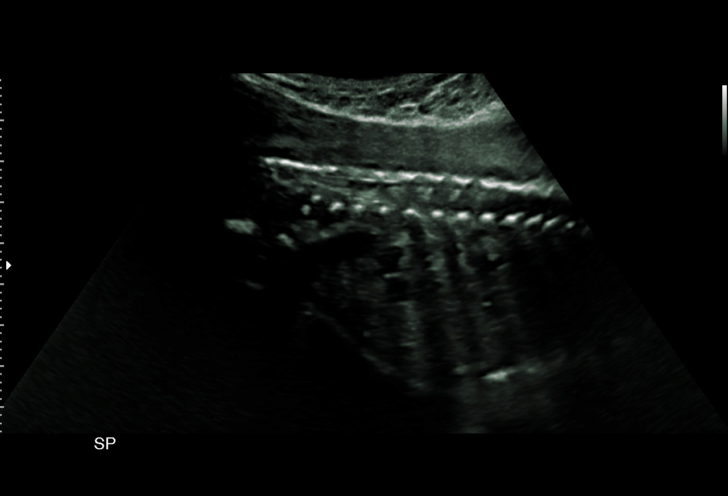
[im 51/92]
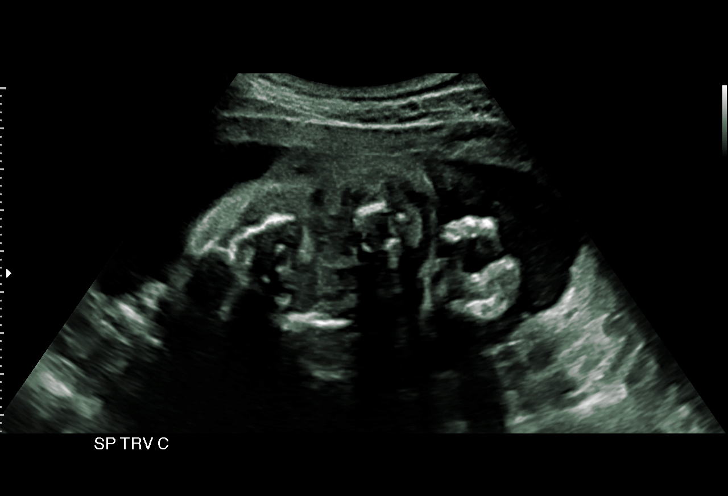
[im 58/92]
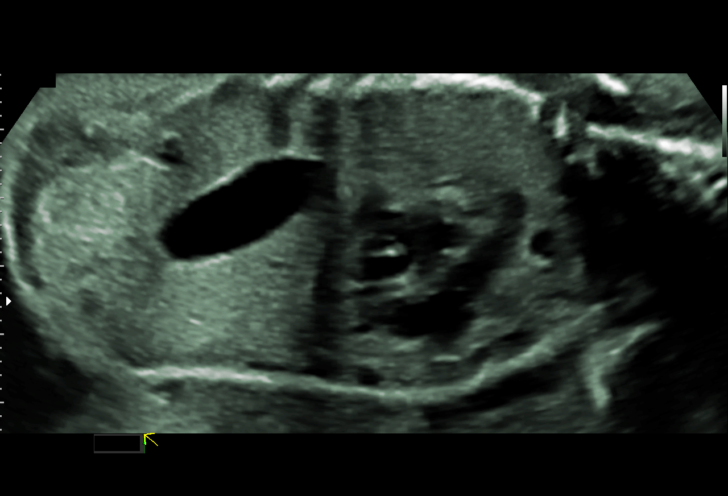
[im 65/92]
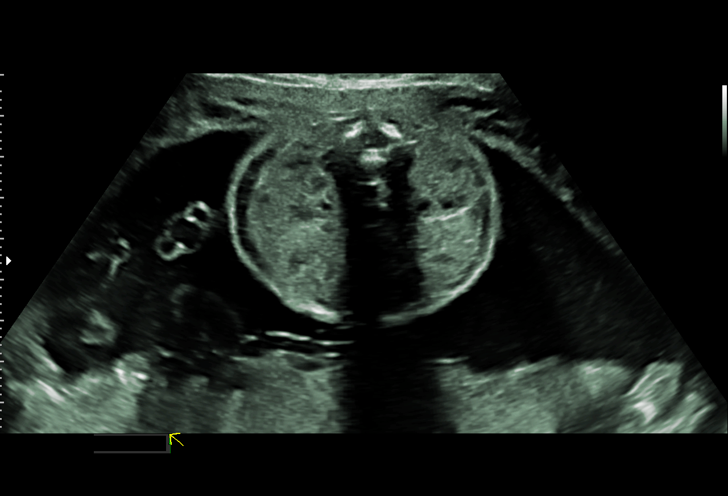
[im 71/92]
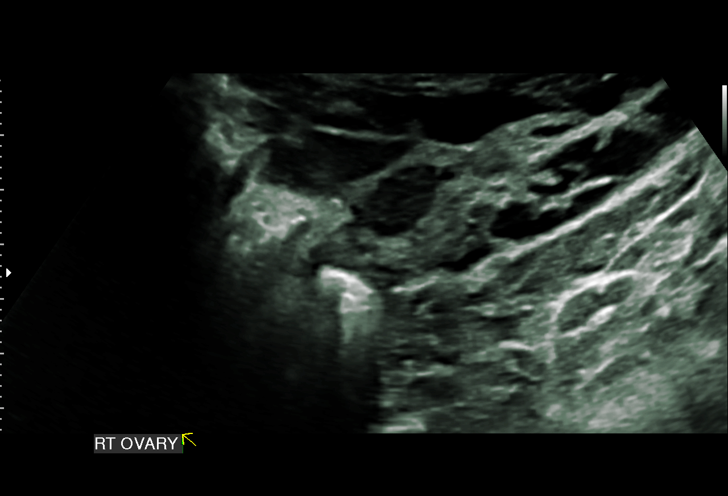
[im 78/92]
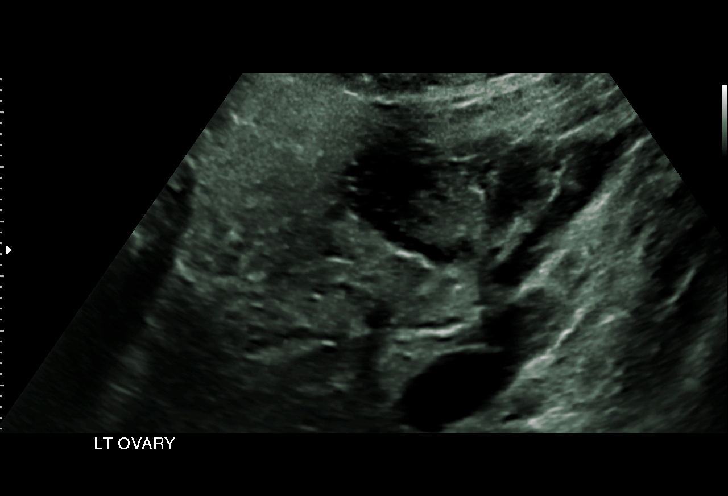
[im 85/92]
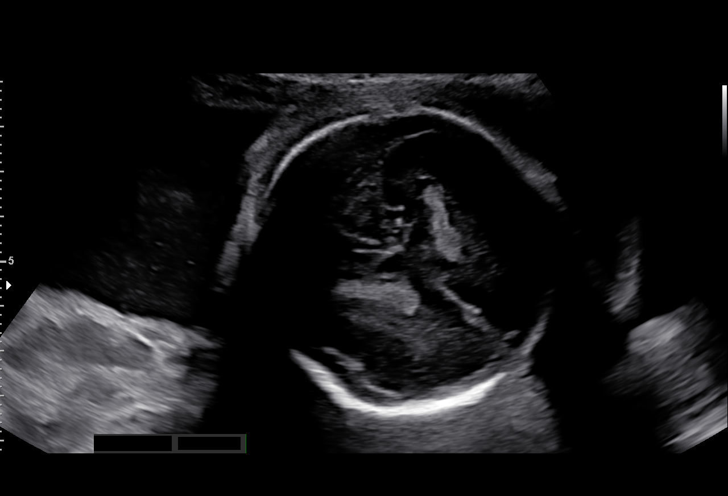
[im 92/92]
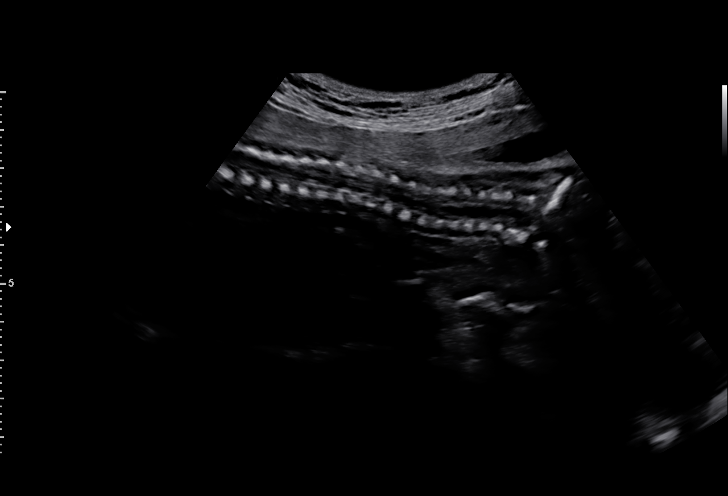

[14 of 28 positions shown; findings below may reference images not displayed]

1  ABDEKARIM JOUINI            [id number]      [id number]     [id number]
Indications

25 weeks gestation of pregnancy
Antenatal follow-up for nonvisualized fetal
anatomy
OB History

Gravidity:    2         Term:   1        Prem:   0        SAB:   0
TOP:          0       Ectopic:  0        Living: 1
Fetal Evaluation

Num Of Fetuses:     1
Cardiac Activity:   Observed
Presentation:       Cephalic
Placenta:           Posterior, above cervical os

Amniotic Fluid
AFI FV:      Subjectively within normal limits

Largest Pocket(cm)
4.2
Biometry

BPD:      69.1  mm     G. Age:  27w 5d         93  %    CI:         76.5   %   70 - 86
FL/HC:      19.5   %   18.6 -
HC:      250.3  mm     G. Age:  27w 1d         71  %    HC/AC:      1.21       1.04 -
AC:      206.2  mm     G. Age:  25w 2d         23  %    FL/BPD:     70.6   %   71 - 87
FL:       48.8  mm     G. Age:  26w 3d         53  %    FL/AC:      23.7   %   20 - 24
HUM:      44.2  mm     G. Age:  26w 2d         55  %
CER:      29.2  mm     G. Age:  26w 1d         54  %
Est. FW:     874  gm    1 lb 15 oz      56  %
Gestational Age

LMP:           25w 1d       Date:   09/03/16                 EDD:   06/10/17
U/S Today:     26w 5d                                        EDD:   05/30/17
Best:          25w 6d    Det. By:   Previous Ultrasound      EDD:   06/05/17
(11/25/16)
Anatomy

Cranium:               Appears normal         Aortic Arch:            Appears normal
Cavum:                 Appears normal         Ductal Arch:            Previously seen
Ventricles:            Appears normal         Diaphragm:              Appears normal
Choroid Plexus:        Appears normal         Stomach:                Appears normal, left
sided
Cerebellum:            Appears normal         Abdomen:                Appears normal
Posterior Fossa:       Appears normal         Abdominal Wall:         Appears nml (cord
insert, abd wall)
Nuchal Fold:           Appears normal         Cord Vessels:           Appears normal (3
vessel cord)
Face:                  Appears normal         Kidneys:                Appear normal
(orbits and profile)
Lips:                  Appears normal         Bladder:                Appears normal
Thoracic:              Appears normal         Spine:                  Appears normal
Heart:                 Appears normal         Upper Extremities:      Appears normal
(4CH, axis, and situs
RVOT:                  Appears normal         Lower Extremities:      Appears normal
LVOT:                  Appears normal

Other:  Fetus appears to be a male. Nasal bone visualized.
Cervix Uterus Adnexa

Cervix
Length:            3.4  cm.
Normal appearance by transabdominal scan.

Left Ovary
Within normal limits.

Right Ovary
Within normal limits.
Impression

Single IUP at 25w 6d
Normal interval anatomy - the fetal survey is now complete
The estimated fetal weight is at the 56th %tile
Posterior placenta without previa
Normal amniotic fluid volume
Recommendations

Follow-up ultrasounds as clinically indicated.

## 2017-10-20 ENCOUNTER — Encounter (HOSPITAL_COMMUNITY): Payer: Self-pay

## 2017-12-28 IMAGING — US US ART/VEN ABD/PELV/SCROTUM DOPPLER LTD
1 series · 14 of 25 positions shown · non-contrast
Comparison: None.

CLINICAL DATA: 20-year-old female with abdominal pain.

EXAM:
OBSTETRIC <14 WK US OB
US DOPPLER ULTRASOUND OF OVARIES
TECHNIQUE: Transabdominal ultrasound examinations was performed for evaluation
of the gestation as well as the maternal uterus, adnexal regions,
and pelvic cul-de-sac.
Color and duplex Doppler ultrasound was utilized to evaluate blood
flow to the ovaries.

[Series 1: us art/ven abd/pelv/scrotum doppler ltd · 0.23mm/px · 41 acquisitions, 14 frames shown]
[im 1/41]
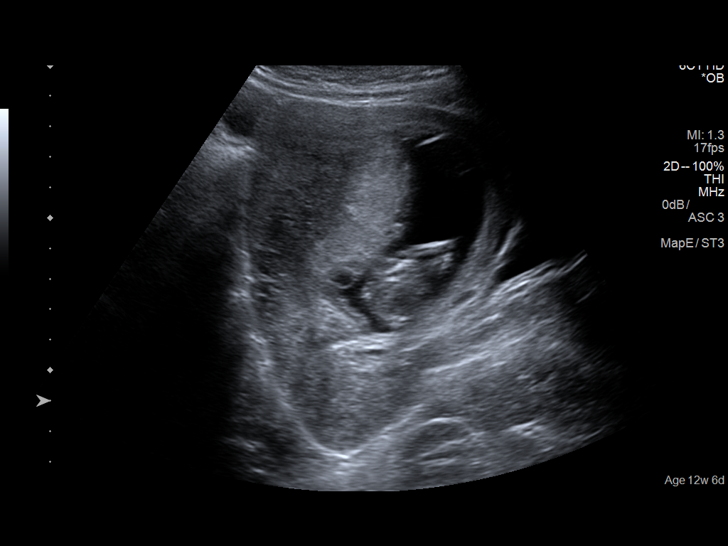
[im 4/41]
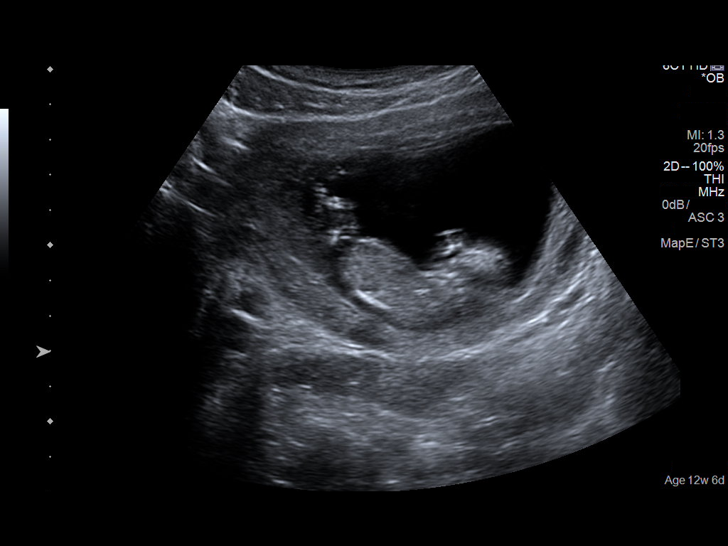
[im 7/41]
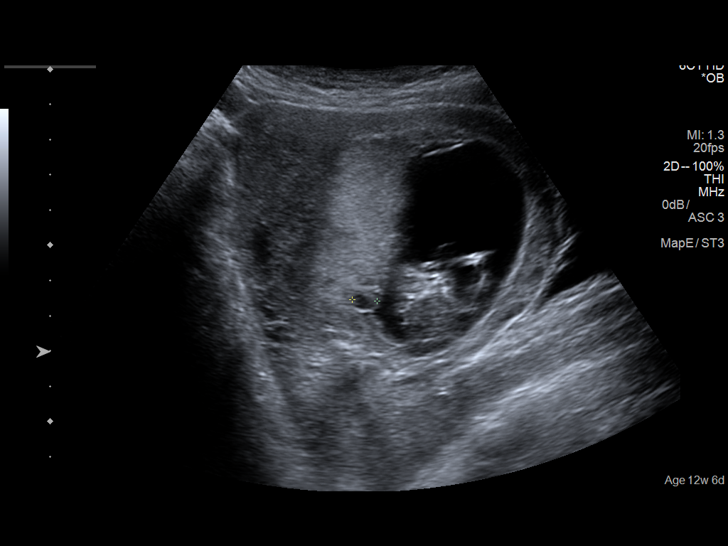
[im 11/41]
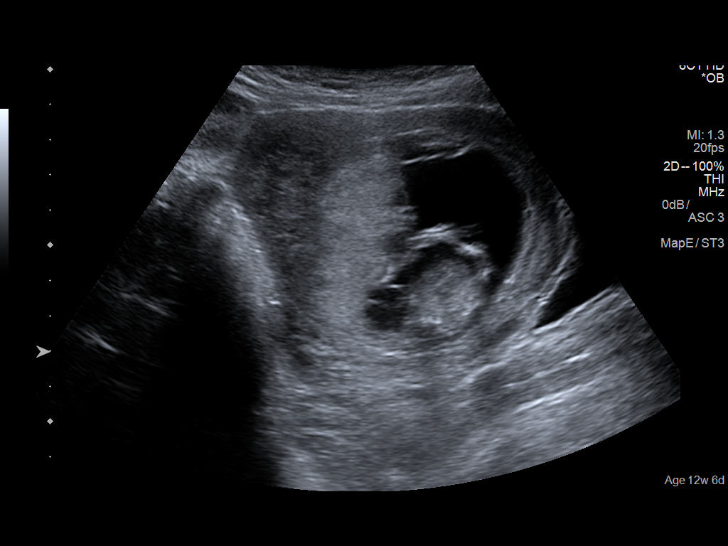
[im 14/41]
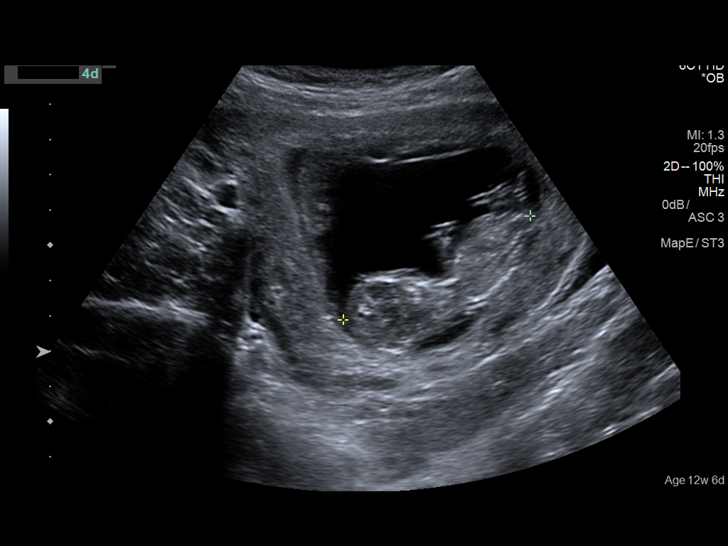
[im 16/41]
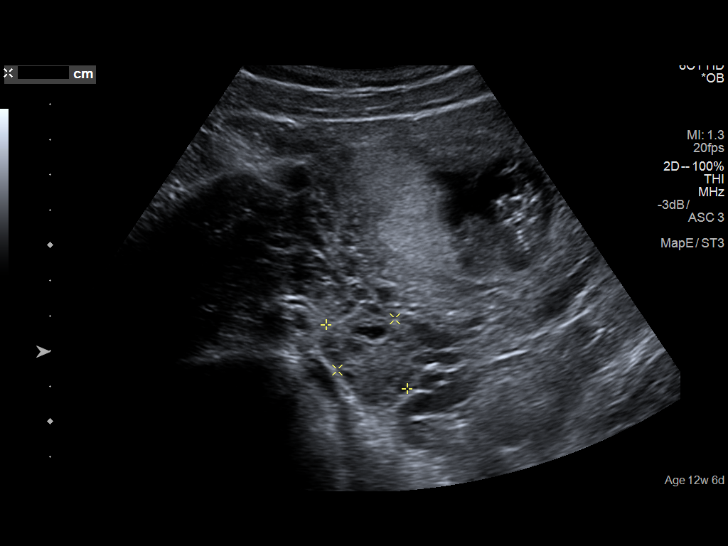
[im 19/41]
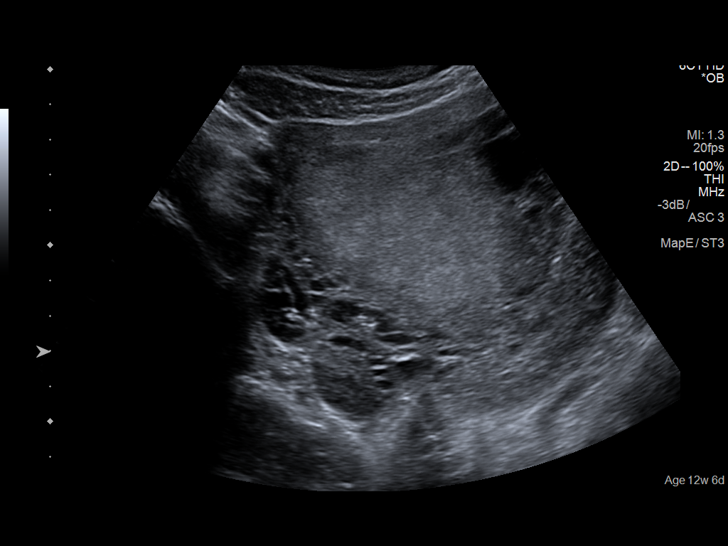
[im 22/41]
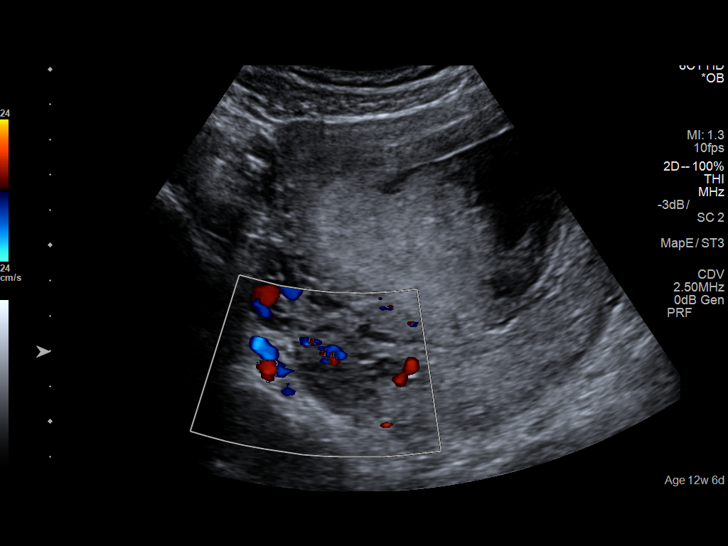
[im 26/41]
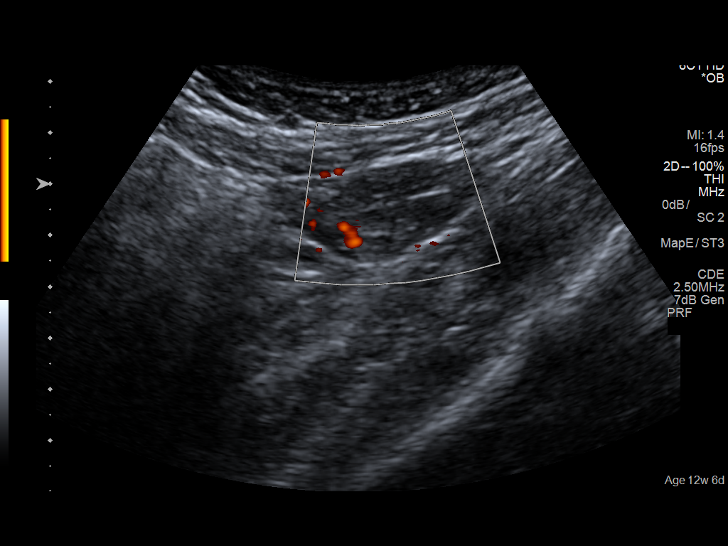
[im 27/41]
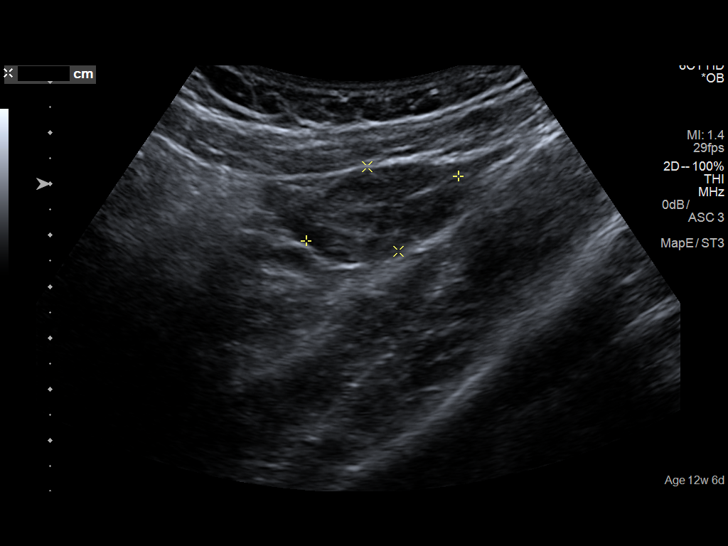
[im 31/41]
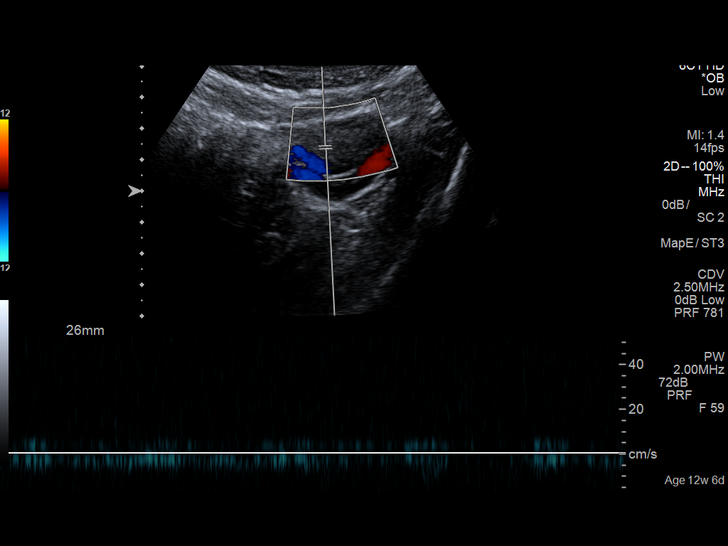
[im 34/41]
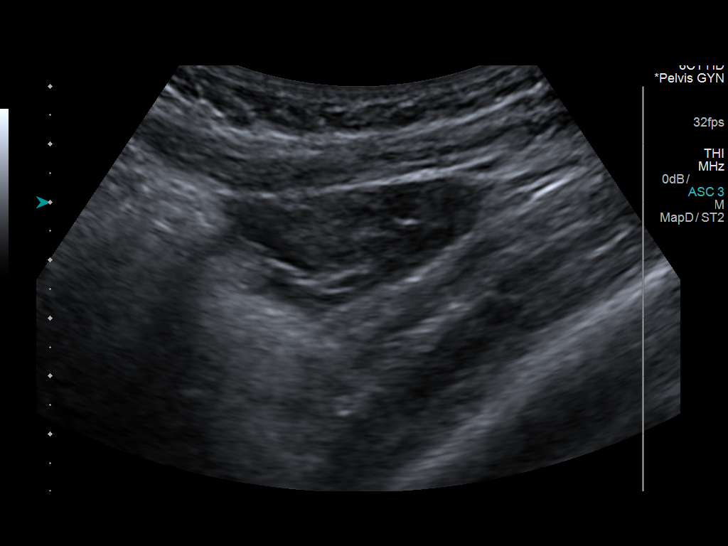
[im 37/41]
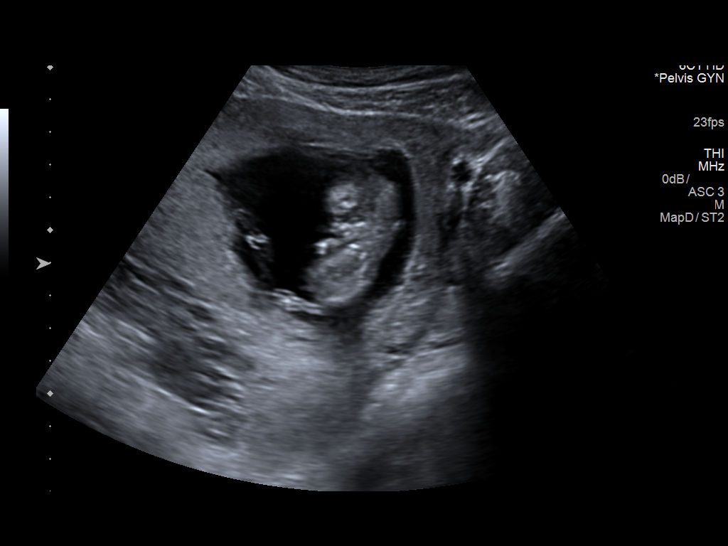
[im 41/41]
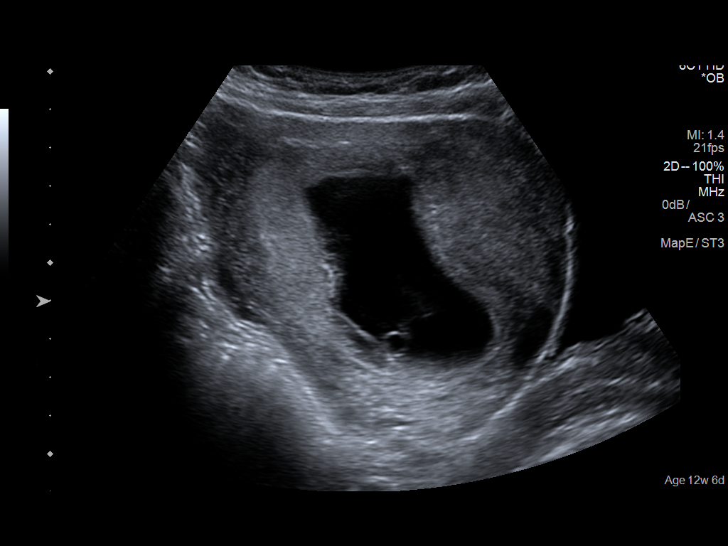

[14 of 25 positions shown; findings below may reference images not displayed]

FINDINGS: Intrauterine gestational sac: Single intrauterine gestational sac

Yolk sac:  Seen

Embryo:  Present

Cardiac Activity: Detected

Heart Rate: 155 bpm

CRL:   61  Mm   12 w 4 d                  US EDC: 06/05/2017

Subchorionic hemorrhage:  None visualized.

Maternal uterus/adnexae: The maternal ovaries appear unremarkable.
The right ovary measures 2.9 x 2.2 x 2.2 cm and the left ovary
measures 3.2 x 1.8 x 2.6 cm.

No significant free fluid within the pelvis.

Pulsed Doppler evaluation of both ovaries demonstrates normal
appearing low-resistance arterial and venous waveforms.
IMPRESSION: Single live intrauterine pregnancy with an estimated gestational age
of 12 weeks, 4 days.

Doppler detected flow to both ovaries.
